# Patient Record
Sex: Male | Born: 1945 | Race: White | Hispanic: No | Marital: Single | Smoking: Never smoker
Health system: Southern US, Community
[De-identification: ages and names within clinical notes are randomized; demographics above are authoritative.]

## PROBLEM LIST (undated history)

## (undated) DIAGNOSIS — I10 Essential (primary) hypertension: Secondary | ICD-10-CM

## (undated) DIAGNOSIS — M109 Gout, unspecified: Secondary | ICD-10-CM

## (undated) HISTORY — PX: BACK SURGERY: SHX140

## (undated) HISTORY — PX: UMBILICAL HERNIA REPAIR: SHX196

## (undated) HISTORY — PX: CHOLECYSTECTOMY: SHX55

## (undated) HISTORY — PX: NECK SURGERY: SHX720

---

## 2002-04-28 ENCOUNTER — Encounter: Payer: Self-pay | Admitting: Family Medicine

## 2002-04-28 ENCOUNTER — Ambulatory Visit (HOSPITAL_COMMUNITY): Admission: RE | Admit: 2002-04-28 | Discharge: 2002-04-28 | Payer: Self-pay | Admitting: Family Medicine

## 2014-02-26 ENCOUNTER — Encounter (HOSPITAL_COMMUNITY): Payer: Self-pay | Admitting: Emergency Medicine

## 2014-02-26 ENCOUNTER — Emergency Department (HOSPITAL_COMMUNITY): Payer: Federal, State, Local not specified - PPO

## 2014-02-26 ENCOUNTER — Emergency Department (HOSPITAL_COMMUNITY)
Admission: EM | Admit: 2014-02-26 | Discharge: 2014-02-26 | Disposition: A | Discharge status: Home / Self Care | Payer: Federal, State, Local not specified - PPO | Attending: Emergency Medicine | Admitting: Emergency Medicine

## 2014-02-26 DIAGNOSIS — Y9389 Activity, other specified: Secondary | ICD-10-CM | POA: Insufficient documentation

## 2014-02-26 DIAGNOSIS — Z791 Long term (current) use of non-steroidal anti-inflammatories (NSAID): Secondary | ICD-10-CM | POA: Insufficient documentation

## 2014-02-26 DIAGNOSIS — R05 Cough: Secondary | ICD-10-CM

## 2014-02-26 DIAGNOSIS — Y9289 Other specified places as the place of occurrence of the external cause: Secondary | ICD-10-CM | POA: Insufficient documentation

## 2014-02-26 DIAGNOSIS — X500XXA Overexertion from strenuous movement or load, initial encounter: Secondary | ICD-10-CM | POA: Insufficient documentation

## 2014-02-26 DIAGNOSIS — R059 Cough, unspecified: Secondary | ICD-10-CM | POA: Insufficient documentation

## 2014-02-26 DIAGNOSIS — S93409A Sprain of unspecified ligament of unspecified ankle, initial encounter: Secondary | ICD-10-CM | POA: Insufficient documentation

## 2014-02-26 DIAGNOSIS — I10 Essential (primary) hypertension: Secondary | ICD-10-CM | POA: Insufficient documentation

## 2014-02-26 DIAGNOSIS — Z79899 Other long term (current) drug therapy: Secondary | ICD-10-CM | POA: Insufficient documentation

## 2014-02-26 HISTORY — DX: Essential (primary) hypertension: I10

## 2014-02-26 MED ORDER — BENZONATATE 200 MG PO CAPS: 200.0000 mg | ORAL_CAPSULE | Freq: Three times a day (TID) | ORAL | Status: DC | PRN

## 2014-02-26 NOTE — Discharge Instructions (Signed)
Ankle Sprain An ankle sprain is an injury to the strong, fibrous tissues (ligaments) that hold your ankle bones together.  HOME CARE   Put ice on your ankle for 1 2 days or as told by your doctor.  Put ice in a plastic bag.  Place a towel between your skin and the bag.  Leave the ice on for 15-20 minutes at a time, every 2 hours while you are awake.  Only take medicine as told by your doctor.  Raise (elevate) your injured ankle above the level of your heart as much as possible for 2 3 days.  Use crutches if your doctor tells you to. Slowly put your own weight on the affected ankle. Use the crutches until you can walk without pain.  If you have a plaster splint:  Do not rest it on anything harder than a pillow for 24 hours.  Do not put weight on it.  Do not get it wet.  Take it off to shower or bathe.  If given, use an elastic wrap or support stocking for support. Take the wrap off if your toes lose feeling (numb), tingle, or turn cold or blue.  If you have an air splint:  Add or let out air to make it comfortable.  Take it off at night and to shower and bathe.  Wiggle your toes and move your ankle up and down often while you are wearing it. GET HELP RIGHT AWAY IF:   Your toes lose feeling (numb) or turn blue.  You have severe pain that is increasing.  You have rapidly increasing bruising or puffiness (swelling).  Your toes feel very cold.  You lose feeling in your foot.  Your medicine does not help your pain. MAKE SURE YOU:   Understand these instructions.  Will watch your condition.  Will get help right away if you are not doing well or get worse. Document Released: 05/08/2008 Document Revised: 08/14/2012 Document Reviewed: 06/03/2012 [facility] LP[facility] Patient Information 2014 [place], [place].  Cough, Adult  A cough is a reflex. It helps you clear your throat and airways. A cough can help heal your body. A cough can last 2 or 3 weeks (acute) or may last more than  8 weeks (chronic). Some common causes of a cough can include an infection, allergy, or a cold. HOME CARE  Only take medicine as told by your doctor.  If given, take your medicines (antibiotics) as told. Finish them even if you start to feel better.  Use a cold steam vaporizer or humidier in your home. This can help loosen thick spit (secretions).  Sleep so you are almost sitting up (semi-upright). Use pillows to do this. This helps reduce coughing.  Rest as needed.  Stop smoking if you smoke. GET HELP RIGHT AWAY IF:  You have yellowish-white fluid (pus) in your thick spit.  Your cough gets worse.  Your medicine does not reduce coughing, and you are losing sleep.  You cough up blood.  You have trouble breathing.  Your pain gets worse and medicine does not help.  You have a fever. MAKE SURE YOU:   Understand these instructions.  Will watch your condition.  Will get help right away if you are not doing well or get worse. Document Released: 08/03/2011 Document Revised: 02/12/2012 Document Reviewed: 08/03/2011 [facility] Patient Information 2014 [place], [place].

## 2014-02-26 NOTE — ED Notes (Signed)
Pt stepped in hole in yard, Pain, swelling present esp rt lat malleolus

## 2014-02-26 NOTE — ED Notes (Signed)
Pt reports that right ankle since last Tuesday. Ankle with edema and erythema on lateral surface. Denies fevers and chills.

## 2014-02-28 NOTE — ED Provider Notes (Signed)
CSN: [id number]     Arrival date & time 02/26/14  1032 History   First MD Initiated Contact with Patient 02/26/14 1045     Chief Complaint  Patient presents with  . Ankle Pain     (Consider location/radiation/quality/duration/timing/severity/associated sxs/prior Treatment) Patient is a 68 y.o. male presenting with ankle pain. The history is provided by the patient.  Ankle Pain Location:  Ankle Time since incident:  1 week Injury: yes   Mechanism of injury comment:  Twisting injury to the right ankle after stepping on an uneven surface Ankle location:  R ankle Pain details:    Quality:  Aching and throbbing   Radiates to:  Does not radiate   Severity:  Moderate   Onset quality:  Sudden   Timing:  Constant   Progression:  Unchanged Chronicity:  New Dislocation: no   Foreign body present:  No foreign bodies Prior injury to area:  No Relieved by:  Nothing Worsened by:  Bearing weight and flexion Ineffective treatments: Epsom. salt soaks. Associated symptoms: swelling   Associated symptoms: no back pain, no decreased ROM, no fatigue, no fever, no neck pain, no numbness and no tingling    Patient also complains of persistent cough for several days. States cough is productive at times of green to yellow sputum. He denies fever, chills, shortness of breath, or chest pain.   Past Medical History  Diagnosis Date  . Hypertension    Past Surgical History  Procedure Laterality Date  . Cholecystectomy    . Neck surgery     History reviewed. No pertinent family history. History  Substance Use Topics  . Smoking status: Never Smoker   . Smokeless tobacco: Not on file  . Alcohol Use: No    Review of Systems  Constitutional: Negative for fever, chills, activity change, appetite change and fatigue.  HENT: Positive for congestion. Negative for sore throat and trouble swallowing.   Respiratory: Positive for cough. Negative for chest tightness, shortness of breath, wheezing and  stridor.   Cardiovascular: Negative for chest pain and palpitations.  Gastrointestinal: Negative for nausea, vomiting and abdominal pain.  Genitourinary: Negative for dysuria and difficulty urinating.  Musculoskeletal: Positive for arthralgias and joint swelling. Negative for back pain and neck pain.  Skin: Negative for color change and wound.  All other systems reviewed and are negative.      Allergies  Review of patient's allergies indicates no known allergies.  Home Medications   Current Outpatient Rx  Name  Route  Sig  Dispense  Refill  . allopurinol (ZYLOPRIM) 300 MG tablet   Oral   Take 0.5 tablets by mouth daily.         . busPIRone (BUSPAR) 10 MG tablet   Oral   Take 1 tablet by mouth 3 (three) times daily.         . cholecalciferol (VITAMIN D) 1000 UNITS tablet   Oral   Take 1,000 Units by mouth daily.         . hydroxypropyl methylcellulose (ISOPTO TEARS) 2.5 % ophthalmic solution   Both Eyes   Place 1 drop into both eyes daily.         Marland Kitchen. ibuprofen (ADVIL,MOTRIN) 400 MG tablet   Oral   Take 1 tablet by mouth 3 (three) times daily.         . methocarbamol (ROBAXIN) 750 MG tablet   Oral   Take 750 mg by mouth 4 (four) times daily.         .Marland Kitchen  nortriptyline (PAMELOR) 50 MG capsule   Oral   Take 1 capsule by mouth at bedtime.         . Omega-3 Fatty Acids (FISH OIL) 1000 MG CAPS   Oral   Take 2 capsules by mouth 3 (three) times daily before meals.         Marland Kitchen omeprazole (PRILOSEC) 20 MG capsule   Oral   Take 1 capsule by mouth daily.         Marland Kitchen terazosin (HYTRIN) 2 MG capsule   Oral   Take 1 capsule by mouth 2 (two) times daily.         Marland Kitchen triamterene-hydrochlorothiazide (MAXZIDE) 75-50 MG per tablet   Oral   Take 1 tablet by mouth daily.         . benzonatate (TESSALON) 200 MG capsule   Oral   Take 1 capsule (200 mg total) by mouth 3 (three) times daily as needed for cough. Swallow whole, do not chew   21 capsule   0    BP  129/83  Pulse 100  Temp(Src) 98 F (36.7 C) (Oral)  Resp 20  Ht 5\' 6"  (1.676 m)  Wt 192 lb (87.091 kg)  BMI 31.00 kg/m2  SpO2 96% Physical Exam  Nursing note and vitals reviewed. Constitutional: He is oriented to person, place, and time. He appears well-developed and well-nourished. No distress.  HENT:  Head: Normocephalic and atraumatic.  Mouth/Throat: Oropharynx is clear and moist. No oropharyngeal exudate.  Neck: Normal range of motion. Neck supple. No thyromegaly present.  Cardiovascular: Normal rate, regular rhythm, normal heart sounds and intact distal pulses.   No murmur heard. Pulmonary/Chest: Effort normal and breath sounds normal. No respiratory distress. He has no wheezes. He has no rales. He exhibits no tenderness.  Abdominal: Soft. He exhibits no distension. There is no tenderness. There is no rebound and no guarding.  Musculoskeletal: He exhibits edema and tenderness.  Tenderness to palpation with moderate to soft tissue swelling to the lateral right malleolus.  Moderate bruising is present.  ROM is preserved.  DP pulse is brisk,distal sensation intact.  No erythema, abrasion, or bony deformity.  No proximal tenderness. Compartments are soft  Lymphadenopathy:    He has no cervical adenopathy.  Neurological: He is alert and oriented to person, place, and time. He exhibits normal muscle tone. Coordination normal.  Skin: Skin is warm and dry.    ED Course  Procedures (including critical care time) Labs Review Labs Reviewed - No data to display Imaging Review Dg Ankle Complete Right  02/26/2014   CLINICAL DATA:  Ankle pain  EXAM: RIGHT ANKLE - COMPLETE 3+ VIEW  COMPARISON:  None.  FINDINGS: Three views of the right ankle reveal the joint mortise to be preserved. The talar dome is intact. There is no evidence of an acute malleolar fracture. The talus and calcaneus exhibit no acute abnormalities. A tiny plantar calcaneal spur is present. There is mild soft tissue swelling  diffusely over the ankle. vVscular type calcifications are demonstrated anteriorly.  IMPRESSION: No acute bony abnormality of the right ankle is demonstrated. There is mild soft tissue swelling. If the patient's symptoms persist and remain unexplained, MRI may be useful.   Electronically Signed   By: David  [place]   On: 02/26/2014 11:06     EKG Interpretation None      MDM   Final diagnoses:  Ankle sprain  Cough    X-rays reviewed and discussed with the patient.  ASO applied,  pain improved, remains NV intact.  He agrees to elevate and apply ice, he was given  Orthopedic referral.   Advised to followup with his primary care physician for recheck regarding the cough if symptoms are not improving. Vital signs are stable. No tachycardia, hypoxia, or tachypnea. Clinical suspicion for PE is low .    Patient is well appearing. And stable for discharge at this time.     Yessenia Maillet L. Trisha Mangle, PA-C 02/28/14 [phone]

## 2014-03-04 NOTE — ED Provider Notes (Signed)
Medical screening examination/treatment/procedure(s) were performed by non-physician practitioner and as supervising physician I was immediately available for consultation/collaboration.   EKG Interpretation None        Urvi Imes L Vedanth Sirico, MD 03/04/14 1507 

## 2015-02-24 ENCOUNTER — Emergency Department (HOSPITAL_COMMUNITY): Payer: Non-veteran care

## 2015-02-24 ENCOUNTER — Encounter (HOSPITAL_COMMUNITY): Payer: Self-pay | Admitting: Emergency Medicine

## 2015-02-24 ENCOUNTER — Emergency Department (HOSPITAL_COMMUNITY)
Admission: EM | Admit: 2015-02-24 | Discharge: 2015-02-24 | Disposition: A | Discharge status: Home / Self Care | Payer: Non-veteran care | Attending: Emergency Medicine | Admitting: Emergency Medicine

## 2015-02-24 DIAGNOSIS — Z9049 Acquired absence of other specified parts of digestive tract: Secondary | ICD-10-CM | POA: Insufficient documentation

## 2015-02-24 DIAGNOSIS — R109 Unspecified abdominal pain: Secondary | ICD-10-CM

## 2015-02-24 DIAGNOSIS — M549 Dorsalgia, unspecified: Secondary | ICD-10-CM | POA: Diagnosis present

## 2015-02-24 DIAGNOSIS — I10 Essential (primary) hypertension: Secondary | ICD-10-CM | POA: Insufficient documentation

## 2015-02-24 DIAGNOSIS — Z79899 Other long term (current) drug therapy: Secondary | ICD-10-CM | POA: Diagnosis not present

## 2015-02-24 DIAGNOSIS — N200 Calculus of kidney: Secondary | ICD-10-CM | POA: Diagnosis not present

## 2015-02-24 DIAGNOSIS — E663 Overweight: Secondary | ICD-10-CM | POA: Diagnosis not present

## 2015-02-24 DIAGNOSIS — R911 Solitary pulmonary nodule: Secondary | ICD-10-CM | POA: Diagnosis not present

## 2015-02-24 LAB — BASIC METABOLIC PANEL
ANION GAP: 9 (ref 5–15)
BUN: 14 mg/dL (ref 6–23)
CALCIUM: 8.9 mg/dL (ref 8.4–10.5)
CO2: 28 mmol/L (ref 19–32)
Chloride: 104 mmol/L (ref 96–112)
Creatinine, Ser: 0.97 mg/dL (ref 0.50–1.35)
GFR calc non Af Amer: 82 mL/min — ABNORMAL LOW (ref >90)
Glucose, Bld: 145 mg/dL — ABNORMAL HIGH (ref 70–99)
Potassium: 3.1 mmol/L — ABNORMAL LOW (ref 3.5–5.1)
SODIUM: 141 mmol/L (ref 135–145)

## 2015-02-24 LAB — URINE MICROSCOPIC-ADD ON

## 2015-02-24 LAB — CBC WITH DIFFERENTIAL/PLATELET
BASOS ABS: 0 10*3/uL (ref 0.0–0.1)
Basophils Relative: 0 % (ref 0–1)
EOS ABS: 0.3 10*3/uL (ref 0.0–0.7)
EOS PCT: 5 % (ref 0–5)
HEMATOCRIT: 44.3 % (ref 39.0–52.0)
HEMOGLOBIN: 15.6 g/dL (ref 13.0–17.0)
Lymphocytes Relative: 50 % — ABNORMAL HIGH (ref 12–46)
Lymphs Abs: 3.5 10*3/uL (ref 0.7–4.0)
MCH: 32.6 pg (ref 26.0–34.0)
MCHC: 35.2 g/dL (ref 30.0–36.0)
MCV: 92.7 fL (ref 78.0–100.0)
MONO ABS: 0.8 10*3/uL (ref 0.1–1.0)
MONOS PCT: 12 % (ref 3–12)
Neutro Abs: 2.3 10*3/uL (ref 1.7–7.7)
Neutrophils Relative %: 33 % — ABNORMAL LOW (ref 43–77)
Platelets: 148 10*3/uL — ABNORMAL LOW (ref 150–400)
RBC: 4.78 MIL/uL (ref 4.22–5.81)
RDW: 13.6 % (ref 11.5–15.5)
WBC: 7 10*3/uL (ref 4.0–10.5)

## 2015-02-24 LAB — URINALYSIS, ROUTINE W REFLEX MICROSCOPIC
Bilirubin Urine: NEGATIVE
GLUCOSE, UA: NEGATIVE mg/dL
KETONES UR: NEGATIVE mg/dL
Leukocytes, UA: NEGATIVE
Nitrite: NEGATIVE
PROTEIN: NEGATIVE mg/dL
Specific Gravity, Urine: >1.03 — ABNORMAL HIGH (ref 1.005–1.030)
Urobilinogen, UA: 0.2 mg/dL (ref 0.0–1.0)
pH: 6 (ref 5.0–8.0)

## 2015-02-24 MED ORDER — TAMSULOSIN HCL 0.4 MG PO CAPS
0.4000 mg | ORAL_CAPSULE | Freq: Every day | ORAL | Status: AC
Start: 2015-02-24 — End: ?

## 2015-02-24 MED ORDER — MORPHINE SULFATE 4 MG/ML IJ SOLN
4.0000 mg | Freq: Once | INTRAMUSCULAR | Status: AC
  Administered 2015-02-24: 4 mg via INTRAVENOUS
  Filled 2015-02-24: qty 1

## 2015-02-24 MED ORDER — SODIUM CHLORIDE 0.9 % IV BOLUS (SEPSIS)
1000.0000 mL | Freq: Once | INTRAVENOUS | Status: AC
  Administered 2015-02-24: 1000 mL via INTRAVENOUS

## 2015-02-24 MED ORDER — OXYCODONE-ACETAMINOPHEN 5-325 MG PO TABS: 1.0000 | ORAL_TABLET | Freq: Four times a day (QID) | ORAL | Status: AC | PRN

## 2015-02-24 MED ORDER — ONDANSETRON HCL 4 MG/2ML IJ SOLN
4.0000 mg | Freq: Once | INTRAMUSCULAR | Status: AC
  Administered 2015-02-24: 4 mg via INTRAMUSCULAR
  Filled 2015-02-24: qty 2

## 2015-02-24 NOTE — Discharge Instructions (Signed)

## 2015-02-24 NOTE — ED Provider Notes (Signed)
CSN: [id number]     Arrival date & time 02/24/15  5621 History   First MD Initiated Contact with Patient 02/24/15 [phone]     Chief Complaint  Patient presents with  . Back Pain     (Consider location/radiation/quality/duration/timing/severity/associated sxs/prior Treatment) HPI  This is a 69 year old male with a history of hypertension who presents with acute onset of left flank and back pain. Patient reports onset of pain at approximately 2 AM. Pain is nonradiating. Currently it is 7 out of 10. He states that it comes and goes. It is sharp. Denies any urinary symptoms. No history of kidney stones. Denies any weakness, numbness, tingling of lower extremities. He states that he had difficulty getting comfortable earlier. He took Motrin and a muscle relaxer with minimal relief. Denies any fevers or steroid use. Denies any chest pain, shortness of breath. Endorses nausea without vomiting.  Past Medical History  Diagnosis Date  . Hypertension    Past Surgical History  Procedure Laterality Date  . Cholecystectomy    . Neck surgery     History reviewed. No pertinent family history. History  Substance Use Topics  . Smoking status: Never Smoker   . Smokeless tobacco: Not on file  . Alcohol Use: No    Review of Systems  Constitutional: Negative.  Negative for fever.  Respiratory: Negative.  Negative for chest tightness and shortness of breath.   Cardiovascular: Negative.  Negative for chest pain.  Gastrointestinal: Positive for nausea. Negative for vomiting, abdominal pain and constipation.  Genitourinary: Positive for flank pain. Negative for dysuria and hematuria.  Musculoskeletal: Positive for back pain.  Neurological: Negative for headaches.  All other systems reviewed and are negative.     Allergies  Review of patient's allergies indicates no known allergies.  Home Medications   Prior to Admission medications   Medication Sig Start Date End Date Taking? Authorizing  Provider  allopurinol (ZYLOPRIM) 300 MG tablet Take 0.5 tablets by mouth daily. 12/09/13  Yes Historical Provider, MD  benzonatate (TESSALON) 200 MG capsule Take 1 capsule (200 mg total) by mouth 3 (three) times daily as needed for cough. Swallow whole, do not chew 02/26/14  Yes Tammi Triplett, PA-C  busPIRone (BUSPAR) 10 MG tablet Take 1 tablet by mouth 3 (three) times daily. 02/09/14  Yes Historical Provider, MD  cholecalciferol (VITAMIN D) 1000 UNITS tablet Take 1,000 Units by mouth daily.   Yes Historical Provider, MD  hydroxypropyl methylcellulose (ISOPTO TEARS) 2.5 % ophthalmic solution Place 1 drop into both eyes daily.   Yes Historical Provider, MD  ibuprofen (ADVIL,MOTRIN) 400 MG tablet Take 1 tablet by mouth 3 (three) times daily. 02/06/14  Yes Historical Provider, MD  methocarbamol (ROBAXIN) 750 MG tablet Take 750 mg by mouth 4 (four) times daily.   Yes Historical Provider, MD  nortriptyline (PAMELOR) 50 MG capsule Take 1 capsule by mouth at bedtime. 01/15/14  Yes Historical Provider, MD  Omega-3 Fatty Acids (FISH OIL) 1000 MG CAPS Take 2 capsules by mouth 3 (three) times daily before meals.   Yes Historical Provider, MD  omeprazole (PRILOSEC) 20 MG capsule Take 1 capsule by mouth daily. 01/22/14  Yes Historical Provider, MD  terazosin (HYTRIN) 2 MG capsule Take 1 capsule by mouth 2 (two) times daily. 01/16/14  Yes Historical Provider, MD  triamterene-hydrochlorothiazide (MAXZIDE) 75-50 MG per tablet Take 1 tablet by mouth daily.   Yes Historical Provider, MD  oxyCODONE-acetaminophen (PERCOCET/ROXICET) 5-325 MG per tablet Take 1 tablet by mouth every 6 (six) hours as  needed for severe pain. 02/24/15   Shon Baton, MD  tamsulosin (FLOMAX) 0.4 MG CAPS capsule Take 1 capsule (0.4 mg total) by mouth daily. 02/24/15   Shon Baton, MD   BP 127/86 mmHg  Pulse 72  Temp(Src) 97.6 F (36.4 C) (Oral)  Resp 20  Ht  (1.702 m)  Wt 194 lb (87.998 kg)  BMI 30.38 kg/m2  SpO2 97% Physical Exam   Constitutional: He is oriented to person, place, and time. He appears well-developed and well-nourished.  Overweight  HENT:  Head: Normocephalic and atraumatic.  Cardiovascular: Normal rate, regular rhythm and normal heart sounds.   No murmur heard. Pulmonary/Chest: Effort normal and breath sounds normal. No respiratory distress. He has no wheezes.  Abdominal: Soft. Bowel sounds are normal. There is no tenderness. There is no rebound.  Genitourinary:  No CVA tenderness  Musculoskeletal: He exhibits no edema.  Neurological: He is alert and oriented to person, place, and time.  Skin: Skin is warm and dry.  Psychiatric: He has a normal mood and affect.  Nursing note and vitals reviewed.   ED Course  Procedures (including critical care time) Labs Review Labs Reviewed  URINALYSIS, ROUTINE W REFLEX MICROSCOPIC - Abnormal; Notable for the following:    Specific Gravity, Urine >1.030 (*)    Hgb urine dipstick MODERATE (*)    All other components within normal limits  CBC WITH DIFFERENTIAL/PLATELET - Abnormal; Notable for the following:    Platelets 148 (*)    Neutrophils Relative % 33 (*)    Lymphocytes Relative 50 (*)    All other components within normal limits  BASIC METABOLIC PANEL - Abnormal; Notable for the following:    Potassium 3.1 (*)    Glucose, Bld 145 (*)    GFR calc non Af Amer 82 (*)    All other components within normal limits  URINE MICROSCOPIC-ADD ON    Imaging Review Ct Renal Stone Study  02/24/2015   CLINICAL DATA:  Initial evaluation for acute left-sided abdominal pain since yesterday.  EXAM: CT ABDOMEN AND PELVIS WITHOUT CONTRAST  TECHNIQUE: Multidetector CT imaging of the abdomen and pelvis was performed following the standard protocol without IV contrast.  COMPARISON:  None.  FINDINGS: Minimal dependent atelectasis noted within the right lung base. There is a 7 mm left lower lobe nodule (series 6, image 16).  The liver demonstrates a normal unenhanced  appearance. Gallbladder is absent. No biliary dilatation. Spleen, adrenal glands, and pancreas demonstrate a normal unenhanced appearance.  On the right, there is no evidence for nephrolithiasis or hydronephrosis. No radiopaque calculi seen along the course of the right renal collecting system.  On the left, there is a 4 mm stone lodged within the distal left ureter (series 2, image 80). No significant left-sided hydronephrosis or hydroureter. No other radiopaque calculi seen within the left kidney or along the course of the left renal collecting system.  Stomach within normal limits. No evidence for bowel obstruction. No abnormal wall thickening or inflammatory stranding seen about the bowels. Sigmoid diverticulosis without evidence for diverticulitis.  Bladder decompressed but grossly normal.  Prostate unremarkable.  Fat containing left inguinal hernia noted.  No free air or fluid. No pathologically enlarged intra-abdominal pelvic lymph nodes identified. 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 No aneurysm.  Prominent multilevel degenerative changes noted within the visualized lumbar spine. 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 No listhesis. No acute osseous abnormality. No worrisome lytic or blastic osseous lesion.  IMPRESSION:  1. 4 mm stone lodged within the distal left ureter without significant hydronephrosis or hydroureter. This stone is positioned approximately 12 mm proximal to the left UVJ. 2. No other acute intra-abdominal or pelvic process. 3. Colonic diverticulosis without acute diverticulitis. 4. 7 mm left lower lobe nodule, indeterminate. If the patient is at high risk for bronchogenic carcinoma, follow-up chest CT at 3-396months is recommended. If the patient is at low risk for bronchogenic carcinoma, follow-up chest CT at 6-12 months is recommended. This recommendation follows the consensus statement: Guidelines for Management of Small Pulmonary Nodules Detected  on CT Scans: A Statement from the Fleischner Society as published in Radiology 2005; 237:395-400.   Electronically Signed   By: Rise MuBenjamin  McClintock M.D.   On: 02/24/2015 04:28     EKG Interpretation None      MDM   Final diagnoses:  Flank pain  Kidney stone  Lung nodule    Patient presents with acute onset of left-sided flank pain. Nontoxic on exam but uncomfortable appearing. Vital signs within normal limits. No significant tenderness on exam. History physical suggestive of possible kidney stones. Lab work obtained. Patient given fluids, nausea medication and pain medication. Lab work notable for hematuria and CT scan shows 4 mm left-sided kidney stone.  CT scan also shows a left lower lobe nodule. Patient does report a remote history of smoking. Discussed with patient follow-up in 3-6 months for CT scan. [facility] He [facility] a copy of his CT scan. Patient able to tolerate fluids. Will be discharged home with pain medication and Flomax. Given urology follow-up if needed. Anticipatory guidance counseled.  After history, exam, and medical workup I feel the patient has been appropriately medically screened and is safe for discharge home. Pertinent diagnoses were discussed with the patient. Patient was given return precautions.     Shon Batonourtney F Horton, MD 02/24/15 [phone]

## 2015-02-24 NOTE — ED Notes (Signed)
Pt c/o sudden onset of left back pain.

## 2015-12-30 IMAGING — CT CT RENAL STONE PROTOCOL
2 of 4 series · 16 of 46 positions shown, 18 images · non-contrast
Comparison: None.

CLINICAL DATA: Initial evaluation for acute left-sided abdominal
pain since yesterday.

EXAM:
CT ABDOMEN AND PELVIS WITHOUT CONTRAST
TECHNIQUE: Multidetector CT imaging of the abdomen and pelvis was performed
following the standard protocol without IV contrast.

[Series 2: standard/full over (age)lbs 5.0 · axial · 0.85mm/px · z∈[-445,-5]mm · 13 of 98 slices shown, 15 images]
[im 5/98  soft-tissue]
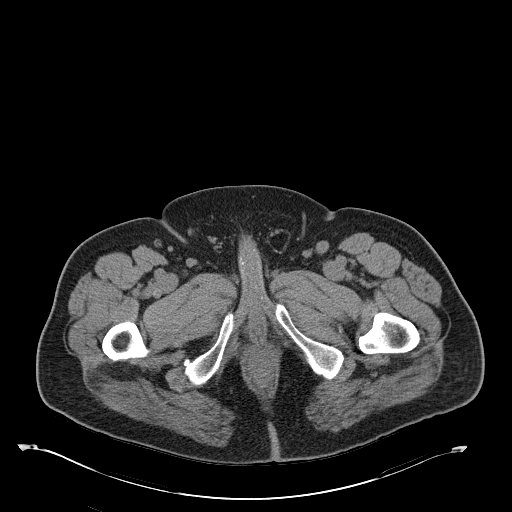
[im 5/98  bone]
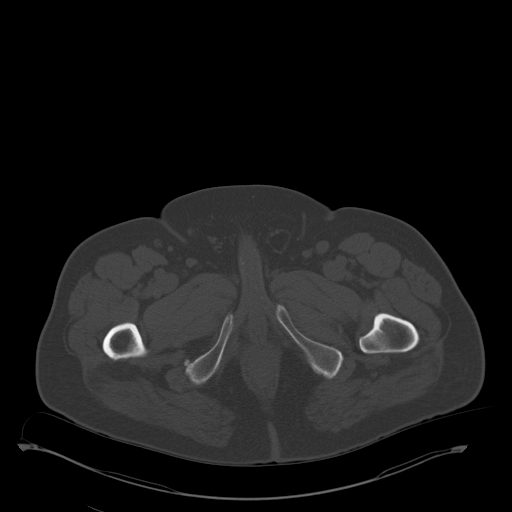
[im 14/98  soft-tissue]
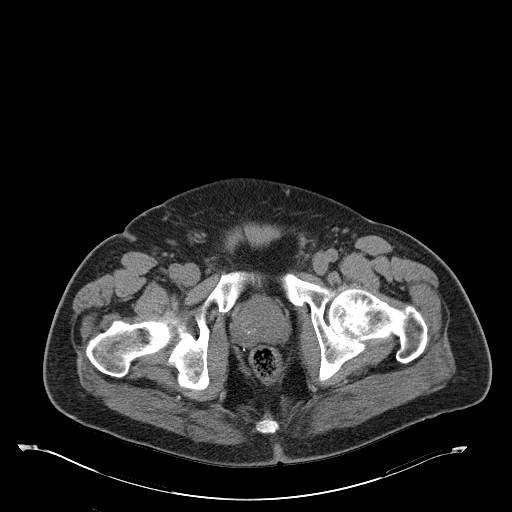
[im 23/98  soft-tissue]
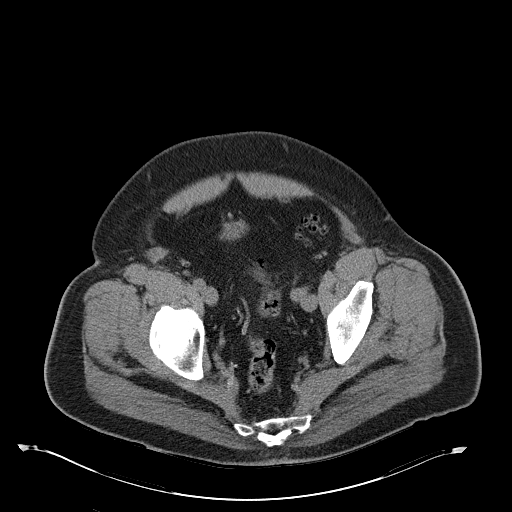
[im 27/98  soft-tissue]
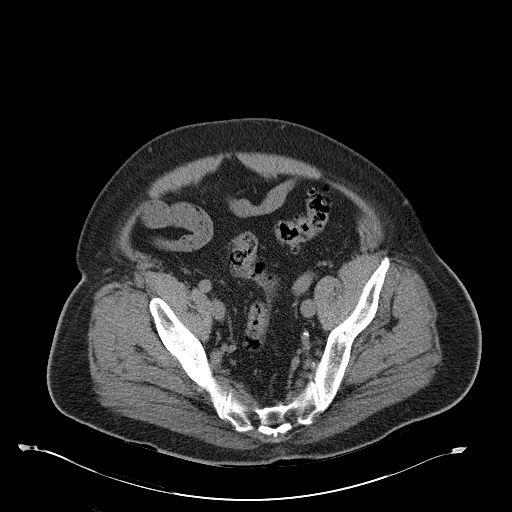
[im 36/98  soft-tissue]
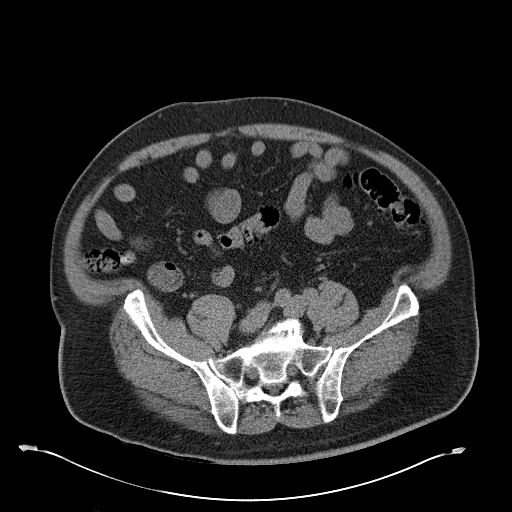
[im 40/98  soft-tissue]
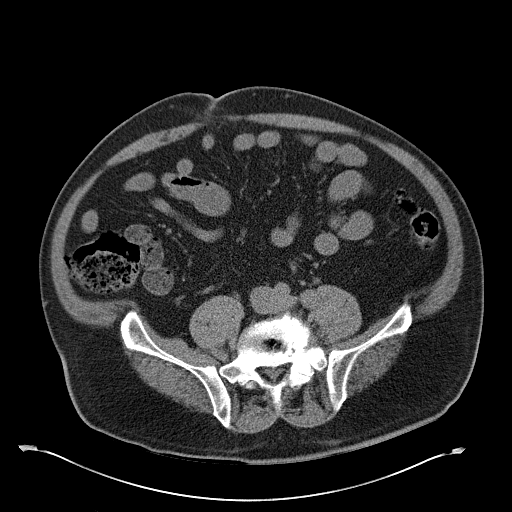
[im 49/98  soft-tissue]
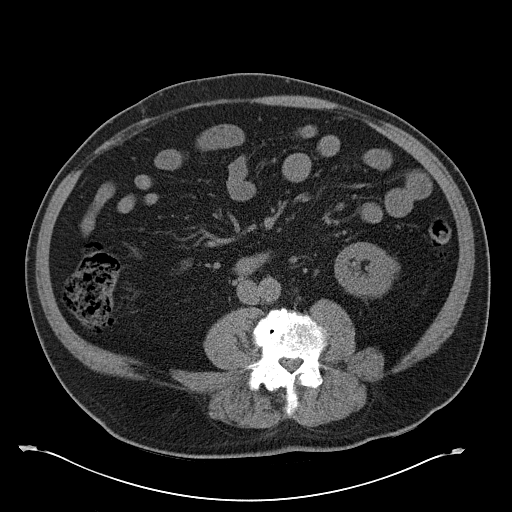
[im 58/98  soft-tissue]
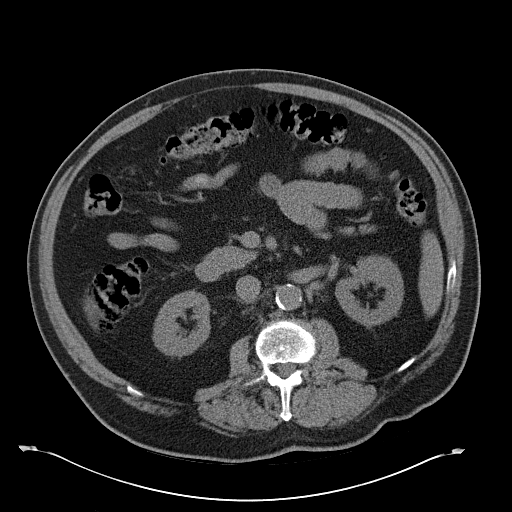
[im 62/98  soft-tissue]
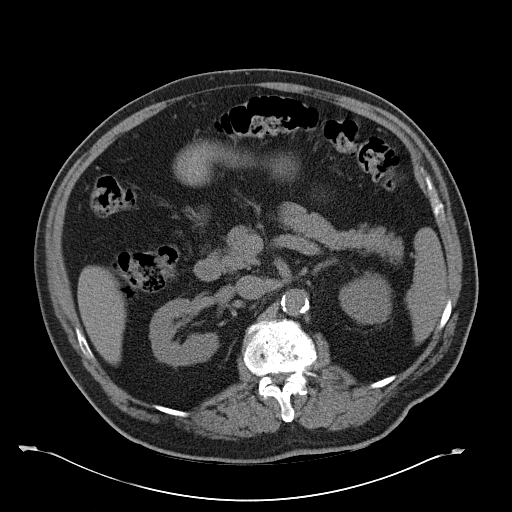
[im 62/98  bone]
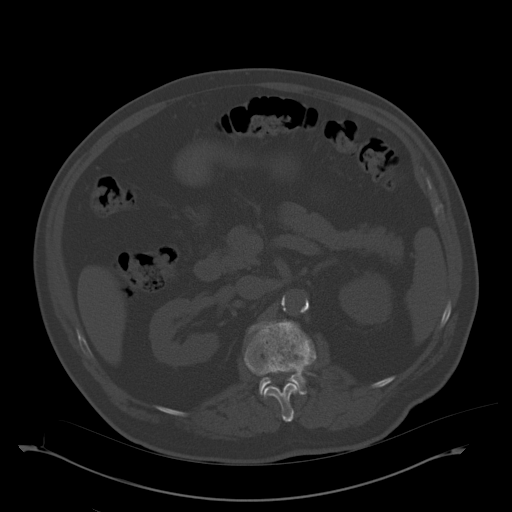
[im 71/98  soft-tissue]
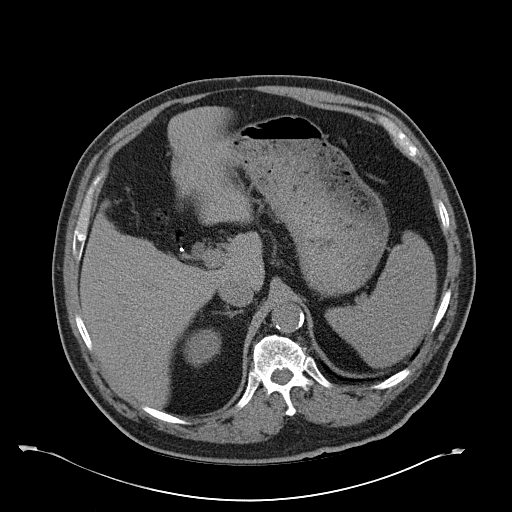
[im 75/98  soft-tissue]
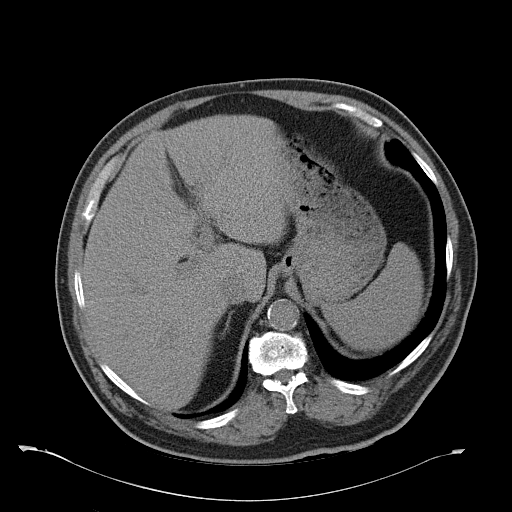
[im 84/98  soft-tissue]
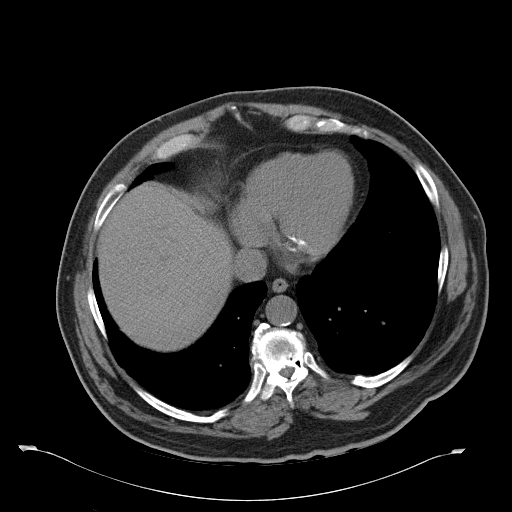
[im 93/98  soft-tissue]
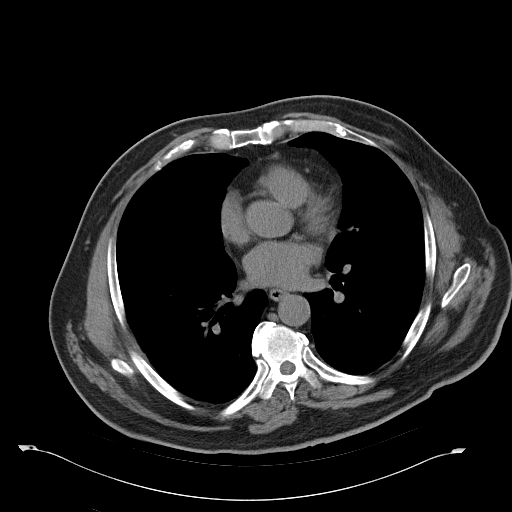

[Series 3: mpr coronal · coronal · 0.96mm/px · 3 of 122 slices shown]
[im 41/122  soft-tissue]
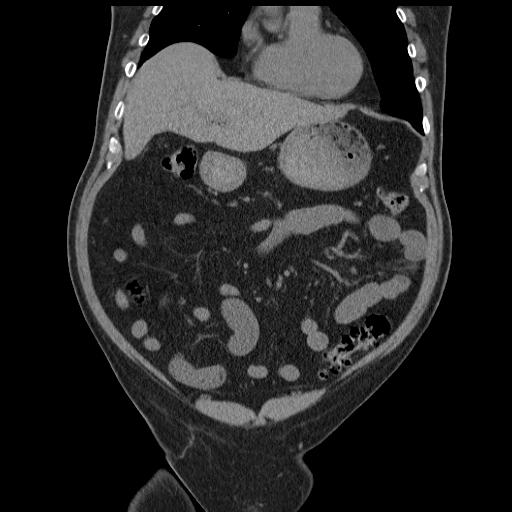
[im 54/122  soft-tissue]
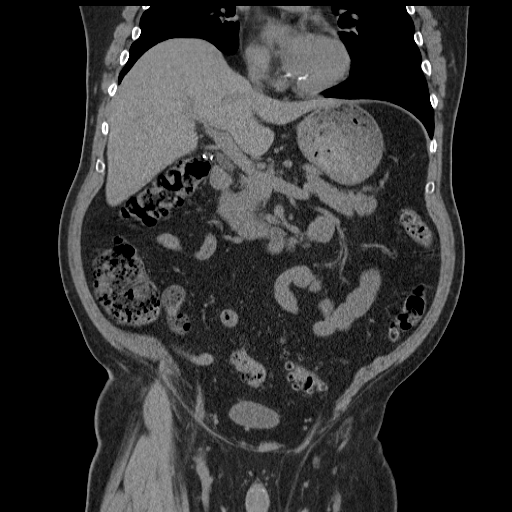
[im 68/122  soft-tissue]
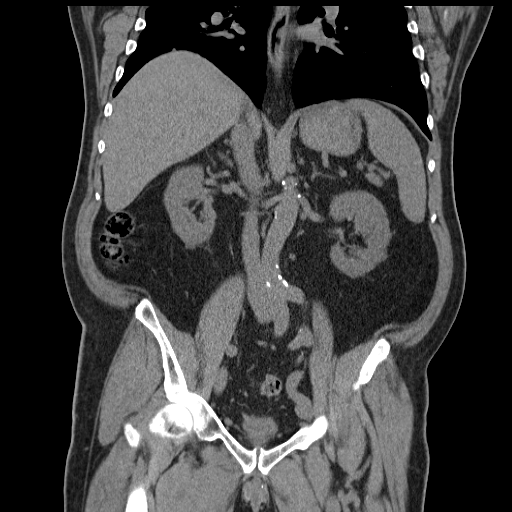

[16 of 46 positions shown; findings below may reference images not displayed]

FINDINGS: Minimal dependent atelectasis noted within the right lung base.
There is a 7 mm left lower lobe nodule (series 6, image 16).

The liver demonstrates a normal unenhanced appearance. Gallbladder
is absent. No biliary dilatation. Spleen, adrenal glands, and
pancreas demonstrate a normal unenhanced appearance.

On the right, there is no evidence for nephrolithiasis or
hydronephrosis. No radiopaque calculi seen along the course of the
right renal collecting system.

On the left, there is a 4 mm stone lodged within the distal left
ureter (series 2, image 80). No significant left-sided
hydronephrosis or hydroureter. No other radiopaque calculi seen
within the left kidney or along the course of the left renal
collecting system.

Stomach within normal limits. No evidence for bowel obstruction. No
abnormal wall thickening or inflammatory stranding seen about the
bowels. Sigmoid diverticulosis without evidence for diverticulitis.

Bladder decompressed but grossly normal.  Prostate unremarkable.

Fat containing left inguinal hernia noted.

No free air or fluid. No pathologically enlarged intra-abdominal
pelvic lymph nodes identified. Moderate atheromatous disease present
within the intra-abdominal aorta. No aneurysm.

Prominent multilevel degenerative changes noted within the
visualized lumbar spine. Chronic right-sided pars defect present at
L3. No listhesis. No acute osseous abnormality. No worrisome lytic
or blastic osseous lesion.
IMPRESSION: 1. 4 mm stone lodged within the distal left ureter without
significant hydronephrosis or hydroureter. This stone is positioned
approximately 12 mm proximal to the left UVJ.
2. No other acute intra-abdominal or pelvic process.
3. Colonic diverticulosis without acute diverticulitis.
4. 7 mm left lower lobe nodule, indeterminate. If the patient is at
high risk for bronchogenic carcinoma, follow-up chest CT at
3-2months is recommended. If the patient is at low risk for
bronchogenic carcinoma, follow-up chest CT at 6-12 months is
recommended. This recommendation follows the consensus statement:
Guidelines for Management of Small Pulmonary Nodules Detected on CT
Scans: A Statement from the [HOSPITAL] as published in

## 2018-08-01 ENCOUNTER — Other Ambulatory Visit: Payer: Self-pay | Admitting: Internal Medicine

## 2018-08-01 DIAGNOSIS — R339 Retention of urine, unspecified: Secondary | ICD-10-CM

## 2020-02-11 ENCOUNTER — Other Ambulatory Visit: Payer: Self-pay

## 2020-02-11 ENCOUNTER — Ambulatory Visit
Admission: EM | Admit: 2020-02-11 | Discharge: 2020-02-11 | Disposition: A | Discharge status: Home / Self Care | Payer: Federal, State, Local not specified - PPO | Attending: Emergency Medicine | Admitting: Emergency Medicine

## 2020-02-11 DIAGNOSIS — M25562 Pain in left knee: Secondary | ICD-10-CM | POA: Diagnosis not present

## 2020-02-11 HISTORY — DX: Gout, unspecified: M10.9

## 2020-02-11 NOTE — ED Provider Notes (Signed)
[facility]   [id number] 02/11/20 Arrival Time: 1761  CC: LT knee pain  SUBJECTIVE: History from: patient. Lance Spencer is a 74 y.o. male complains of left knee pain x 1 week.  Denies a precipitating event or specific injury.  Localizes the pain to the inside of left knee.  Describes the pain as intermittent and achy in character.  Has tried toradol and muscle relaxor without relief.  Worse with using knee to get up on furniture.  Denies fever, chills, erythema, ecchymosis, effusion, weakness, numbness and tingling.  ROS: As per HPI.  All other pertinent ROS negative.     Past Medical History:  Diagnosis Date  . Gout   . Hypertension    Past Surgical History:  Procedure Laterality Date  . BACK SURGERY    . CHOLECYSTECTOMY    . NECK SURGERY    . UMBILICAL HERNIA REPAIR     No Known Allergies No current facility-administered medications on file prior to encounter.   Current Outpatient Medications on File Prior to Encounter  Medication Sig Dispense Refill  . allopurinol (ZYLOPRIM) 300 MG tablet Take 0.5 tablets by mouth daily.    . busPIRone (BUSPAR) 10 MG tablet Take 1 tablet by mouth 3 (three) times daily.    . cholecalciferol (VITAMIN D) 1000 UNITS tablet Take 1,000 Units by mouth daily.    . hydroxypropyl methylcellulose (ISOPTO TEARS) 2.5 % ophthalmic solution Place 1 drop into both eyes daily.    Marland Kitchen ibuprofen (ADVIL,MOTRIN) 400 MG tablet Take 1 tablet by mouth 3 (three) times daily.    . methocarbamol (ROBAXIN) 750 MG tablet Take 750 mg by mouth 4 (four) times daily.    . nortriptyline (PAMELOR) 50 MG capsule Take 1 capsule by mouth at bedtime.    . Omega-3 Fatty Acids (FISH OIL) 1000 MG CAPS Take 2 capsules by mouth 3 (three) times daily before meals.    Marland Kitchen omeprazole (PRILOSEC) 20 MG capsule Take 1 capsule by mouth daily.    Marland Kitchen oxyCODONE-acetaminophen (PERCOCET/ROXICET) 5-325 MG per tablet Take 1 tablet by mouth every 6 (six) hours as needed for severe pain. 15  tablet 0  . tamsulosin (FLOMAX) 0.4 MG CAPS capsule Take 1 capsule (0.4 mg total) by mouth daily. 10 capsule 0  . terazosin (HYTRIN) 2 MG capsule Take 1 capsule by mouth 2 (two) times daily.    Marland Kitchen triamterene-hydrochlorothiazide (MAXZIDE) 75-50 MG per tablet Take 1 tablet by mouth daily.     Social History   Socioeconomic History  . Marital status: Single    Spouse name: Not on file  . Number of children: Not on file  . Years of education: Not on file  . Highest education level: Not on file  Occupational History  . Not on file  Tobacco Use  . Smoking status: Never Smoker  . Smokeless tobacco: Never Used  Substance and Sexual Activity  . Alcohol use: No  . Drug use: No  . Sexual activity: Not on file  Other Topics Concern  . Not on file  Social History Narrative  . Not on file   Social Determinants of Health   Financial Resource Strain:   . Difficulty of Paying Living Expenses: Not on file  Food Insecurity:   . Worried About Charity fundraiser in the Last Year: Not on file  . Ran Out of Food in the Last Year: Not on file  Transportation Needs:   . Lack of Transportation (Medical): Not on file  .  Lack of Transportation (Non-Medical): Not on file  Physical Activity:   . Days of Exercise per Week: Not on file  . Minutes of Exercise per Session: Not on file  Stress:   . Feeling of Stress : Not on file  Social Connections:   . Frequency of Communication with Friends and Family: Not on file  . Frequency of Social Gatherings with Friends and Family: Not on file  . Attends Religious Services: Not on file  . Active Member of Clubs or Organizations: Not on file  . Attends Banker Meetings: Not on file  . Marital Status: Not on file  Intimate Partner Violence:   . Fear of Current or Ex-Partner: Not on file  . Emotionally Abused: Not on file  . Physically Abused: Not on file  . Sexually Abused: Not on file   Family History  Problem Relation Age of Onset  .  Healthy Mother   . Healthy Father     OBJECTIVE:  Vitals:   02/11/20 1103  BP: (!) 152/78  Pulse: 86  Resp: 16  Temp: 97.6 F (36.4 C)  TempSrc: Oral  SpO2: 96%    General appearance: ALERT; in no acute distress.  Head: NCAT Lungs: Normal respiratory effort CV: Dorsalis pedis pulse 2+. Musculoskeletal: LT knee Inspection: Skin warm, dry, clear and intact without obvious erythema, effusion, or ecchymosis.  Palpation: Mildly TTP over medial joint line ROM: FROM active and passive Strength:  5/5 knee abduction, 5/5 knee adduction, 5/5 knee flexion, 5/5 knee extension Stability: Anterior/ posterior drawer intact Skin: warm and dry Neurologic: Ambulates without difficulty; Sensation intact about the lower extremities Psychological: alert and cooperative; normal mood and affect   ASSESSMENT & PLAN:  1. Acute pain of left knee     Continue conservative management of rest, ice, and gentle stretches Use prescribed medications as directed by the [facility] Knee brace given in office Follow up with PCP or orthopedist for further evaluation and management Return or go to the ER if you have any new or worsening symptoms (fever, chills, chest pain, swelling, redness, worsening symptoms despite medication, etc...)    Reviewed expectations re: course of current medical issues. Questions answered. Outlined signs and symptoms indicating need for more acute intervention. Patient verbalized understanding. After Visit Summary given.    Rennis Harding, PA-C 02/11/20 1139

## 2020-02-11 NOTE — Discharge Instructions (Signed)
Continue conservative management of rest, ice, and gentle stretches Use prescribed medications as directed by the [facility] Knee brace given in office Follow up with PCP or orthopedist for further evaluation and management Return or go to the ER if you have any new or worsening symptoms (fever, chills, chest pain, swelling, redness, worsening symptoms despite medication, etc...)

## 2020-02-11 NOTE — ED Triage Notes (Signed)
Pt presents to UC w/ c/o left knee pain x1 week. Denies falling or hitting knee. Left knee is not swollen or bruised.  Pt is able to walk but has pain when bearing weight on that leg.

## 2020-02-14 ENCOUNTER — Telehealth: Payer: Self-pay | Admitting: Emergency Medicine

## 2020-02-14 MED ORDER — PREDNISONE 10 MG PO TABS: 20.0000 mg | ORAL_TABLET | Freq: Every day | ORAL | 0 refills | Status: DC

## 2020-02-14 NOTE — Telephone Encounter (Signed)
Patient called requesting a prednisone.  Was seen on 02/11/2020 and was diagnosed with knee pain and was given the option to have a steroid prescription but declined.  Medication was sent.

## 2020-02-18 ENCOUNTER — Ambulatory Visit
Admission: EM | Admit: 2020-02-18 | Discharge: 2020-02-18 | Disposition: A | Discharge status: Home / Self Care | Payer: Non-veteran care | Attending: Emergency Medicine | Admitting: Emergency Medicine

## 2020-02-18 ENCOUNTER — Other Ambulatory Visit: Payer: Self-pay

## 2020-02-18 DIAGNOSIS — W57XXXA Bitten or stung by nonvenomous insect and other nonvenomous arthropods, initial encounter: Secondary | ICD-10-CM

## 2020-02-18 DIAGNOSIS — R21 Rash and other nonspecific skin eruption: Secondary | ICD-10-CM | POA: Diagnosis not present

## 2020-02-18 MED ORDER — DOXYCYCLINE HYCLATE 100 MG PO CAPS: 200.0000 mg | ORAL_CAPSULE | Freq: Once | ORAL | 0 refills | Status: AC

## 2020-02-18 NOTE — Discharge Instructions (Addendum)
Prescribed single prophylactic dose of doxycycline 200 mg To prevent tick bites, wear long sleeves, long pants, and light colors. Use DEET  insect repellent. Follow the instructions on the bottle. If the tick is biting, do not try to remove it with heat, alcohol, petroleum jelly, or fingernail polish. Use tweezers, curved forceps, or a tick-removal tool to grasp the tick. Gently pull up until the tick lets go. Do not twist or jerk the tick. Do not squeeze or crush the tick. Return here or go to ER if you have any new or worsening symptoms (rash, nausea, vomiting, fever, chills, headache, fatigue 

## 2020-02-18 NOTE — ED Provider Notes (Addendum)
[facility]    CSN: [id number] Arrival date & time: 02/18/20  0941      History   Chief Complaint Chief Complaint  Patient presents with  . Insect Bite    HPI Lance Spencer is a 74 y.o. male.   Who presented to the urgent care with a complaint of tick bite to  right thigh for the past 1 day.  Denies any precipitating event, noticed while changing clothes.  Localizes the bite to his right thigh.  Has removed the tick.  Reports area look irritated and red.  Denies previous history of tick bite.  Denies fever, chills, nausea, vomiting, headache, dizziness, weakness, fatigue,  or abdominal pain.      Past Medical History:  Diagnosis Date  . Gout   . Hypertension     There are no problems to display for this patient.   Past Surgical History:  Procedure Laterality Date  . BACK SURGERY    . CHOLECYSTECTOMY    . NECK SURGERY    . UMBILICAL HERNIA REPAIR         Home Medications    Prior to Admission medications   Medication Sig Start Date End Date Taking? Authorizing Provider  allopurinol (ZYLOPRIM) 300 MG tablet Take 0.5 tablets by mouth daily. 12/09/13   [provider]  busPIRone (BUSPAR) 10 MG tablet Take 1 tablet by mouth 3 (three) times daily. 02/09/14   [provider]  cholecalciferol (VITAMIN D) 1000 UNITS tablet Take 1,000 Units by mouth daily.    [provider]  hydroxypropyl methylcellulose (ISOPTO TEARS) 2.5 % ophthalmic solution Place 1 drop into both eyes daily.    [provider]  ibuprofen (ADVIL,MOTRIN) 400 MG tablet Take 1 tablet by mouth 3 (three) times daily. 02/06/14   [provider]  methocarbamol (ROBAXIN) 750 MG tablet Take 750 mg by mouth 4 (four) times daily.    [provider]  nortriptyline (PAMELOR) 50 MG capsule Take 1 capsule by mouth at bedtime. 01/15/14   [provider]  Omega-3 Fatty Acids (FISH OIL) 1000 MG CAPS Take 2 capsules by mouth 3 (three) times daily before  meals.    [provider]  omeprazole (PRILOSEC) 20 MG capsule Take 1 capsule by mouth daily. 01/22/14   [provider]  oxyCODONE-acetaminophen (PERCOCET/ROXICET) 5-325 MG per tablet Take 1 tablet by mouth every 6 (six) hours as needed for severe pain. 02/24/15   Horton, Barbette Hair, MD  predniSONE (DELTASONE) 10 MG tablet Take 2 tablets (20 mg total) by mouth daily. 02/14/20   Sandrina Heaton, Darrelyn Hillock, FNP  tamsulosin (FLOMAX) 0.4 MG CAPS capsule Take 1 capsule (0.4 mg total) by mouth daily. 02/24/15   Horton, Barbette Hair, MD  terazosin (HYTRIN) 2 MG capsule Take 1 capsule by mouth 2 (two) times daily. 01/16/14   [provider]  triamterene-hydrochlorothiazide (MAXZIDE) 75-50 MG per tablet Take 1 tablet by mouth daily.    [provider]    Family History Family History  Problem Relation Age of Onset  . Healthy Mother   . Healthy Father     Social History Social History   Tobacco Use  . Smoking status: Never Smoker  . Smokeless tobacco: Never Used  Substance Use Topics  . Alcohol use: No  . Drug use: No     Allergies   Patient has no known allergies.   Review of Systems Review of Systems  Constitutional: Negative.   Respiratory: Negative.   Cardiovascular: Negative.  Skin: Positive for color change.  All other systems reviewed and are negative.    Physical Exam Triage Vital Signs ED Triage Vitals  Enc Vitals Group     BP      Pulse      Resp      Temp      Temp src      SpO2      Weight      Height      Head Circumference      Peak Flow      Pain Score      Pain Loc      Pain Edu?      Excl. in GC?    No data found.  Updated Vital Signs BP (!) 146/87   Pulse 74   Temp 97.6 F (36.4 C)   Resp 18   SpO2 94%   Visual Acuity Right Eye Distance:   Left Eye Distance:   Bilateral Distance:    Right Eye Near:   Left Eye Near:    Bilateral Near:     Physical Exam Vitals and nursing note reviewed.  Constitutional:       General: He is not in acute distress.    Appearance: Normal appearance. He is normal weight. He is not ill-appearing or toxic-appearing.  Cardiovascular:     Rate and Rhythm: Normal rate and regular rhythm.     Pulses: Normal pulses.     Heart sounds: Normal heart sounds. No murmur. No friction rub. No gallop.   Pulmonary:     Effort: Pulmonary effort is normal. No respiratory distress.     Breath sounds: Normal breath sounds. No wheezing, rhonchi or rales.  Chest:     Chest wall: No tenderness.  Skin:    General: Skin is warm.     Findings: Erythema and rash present. Rash is macular.  Neurological:     Mental Status: He is alert.      UC Treatments / Results  Labs (all labs ordered are listed, but only abnormal results are displayed) Labs Reviewed - No data to display  EKG   Radiology No results found.  Procedures Procedures (including critical care time)  Medications Ordered in UC Medications - No data to display  Initial Impression / Assessment and Plan / UC Course  I have reviewed the triage vital signs and the nursing notes.  Pertinent labs & imaging results that were available during my care of the patient were reviewed by me and considered in my medical decision making (see chart for details).     Patient is stable at discharge. Will prescribe single dose of doxycycline Advised patient to follow-up with PCP Strict ER return precaution was given   Final Clinical Impressions(s) / UC Diagnoses   Final diagnoses:  Tick bite, initial encounter  Rash in adult     Discharge Instructions     Prescribed single prophylactic dose of doxycycline 200 mg To prevent tick bites, wear long sleeves, long pants, and light colors. Use DEET insect repellent. Follow the instructions on the bottle. If the tick is biting, do not try to remove it with heat, alcohol, petroleum jelly, or fingernail polish. Use tweezers, curved forceps, or a tick-removal tool to grasp the  tick. Gently pull up until the tick lets go. Do not twist or jerk the tick. Do not squeeze or crush the tick. Return here or go to ER if you have any new or worsening symptoms (rash, nausea, vomiting, fever,  chills, headache, fatigue)     ED Prescriptions    Medication Sig Dispense Auth. Provider   doxycycline (VIBRAMYCIN) 100 MG capsule Take 2 capsules (200 mg total) by mouth once for 1 dose. 2 capsule Zakhai Meisinger, Zachery Dakins, FNP     PDMP not reviewed this encounter.       Durward Parcel, FNP 03/12/20 1927

## 2020-02-18 NOTE — ED Triage Notes (Signed)
Pt presents with tick bite on back of right thigh. Tick has been removed but states area looks irritated and abnormal

## 2021-10-11 ENCOUNTER — Other Ambulatory Visit: Payer: Self-pay

## 2021-10-11 ENCOUNTER — Ambulatory Visit
Admission: EM | Admit: 2021-10-11 | Discharge: 2021-10-11 | Disposition: A | Discharge status: Home / Self Care | Payer: Non-veteran care | Attending: Student | Admitting: Student

## 2021-10-11 DIAGNOSIS — J069 Acute upper respiratory infection, unspecified: Secondary | ICD-10-CM | POA: Diagnosis not present

## 2021-10-11 MED ORDER — PREDNISONE 20 MG PO TABS: 20.0000 mg | ORAL_TABLET | Freq: Every day | ORAL | 0 refills | Status: AC

## 2021-10-11 NOTE — ED Provider Notes (Signed)
[facility]    CSN: [id number] Arrival date & time: 10/11/21  1441      History   Chief Complaint Chief Complaint  Patient presents with  . Nasal Congestion    HPI KARTIER ATER is a 75 y.o. male presenting with nasal congestion and facial pressure for 4 days following exposure to a head cold.  Medical history noncontributory, denies allergic rhinitis component.  Denies facial pressure, purulent nasal discharge, headaches.  Occasional nonproductive cough, denies sore throat or postnasal drip.  Has not tried medications for the symptoms.  Does not monitor temperature at home, denies chills.  HPI  Past Medical History:  Diagnosis Date  . Gout   . Hypertension     There are no problems to display for this patient.   Past Surgical History:  Procedure Laterality Date  . BACK SURGERY    . CHOLECYSTECTOMY    . NECK SURGERY    . UMBILICAL HERNIA REPAIR         Home Medications    Prior to Admission medications   Medication Sig Start Date End Date Taking? Authorizing Provider  predniSONE (DELTASONE) 20 MG tablet Take 1 tablet (20 mg total) by mouth daily for 5 days. 10/11/21 10/16/21 Yes Hazel Sams, PA-C  allopurinol (ZYLOPRIM) 300 MG tablet Take 0.5 tablets by mouth daily. 12/09/13   [provider]  busPIRone (BUSPAR) 10 MG tablet Take 1 tablet by mouth 3 (three) times daily. 02/09/14   [provider]  cholecalciferol (VITAMIN D) 1000 UNITS tablet Take 1,000 Units by mouth daily.    [provider]  hydroxypropyl methylcellulose (ISOPTO TEARS) 2.5 % ophthalmic solution Place 1 drop into both eyes daily.    [provider]  ibuprofen (ADVIL,MOTRIN) 400 MG tablet Take 1 tablet by mouth 3 (three) times daily. 02/06/14   [provider]  methocarbamol (ROBAXIN) 750 MG tablet Take 750 mg by mouth 4 (four) times daily.    [provider]  nortriptyline (PAMELOR) 50 MG capsule Take 1 capsule by mouth at bedtime.  01/15/14   [provider]  Omega-3 Fatty Acids (FISH OIL) 1000 MG CAPS Take 2 capsules by mouth 3 (three) times daily before meals.    [provider]  omeprazole (PRILOSEC) 20 MG capsule Take 1 capsule by mouth daily. 01/22/14   [provider]  oxyCODONE-acetaminophen (PERCOCET/ROXICET) 5-325 MG per tablet Take 1 tablet by mouth every 6 (six) hours as needed for severe pain. 02/24/15   Horton, Barbette Hair, MD  tamsulosin (FLOMAX) 0.4 MG CAPS capsule Take 1 capsule (0.4 mg total) by mouth daily. 02/24/15   Horton, Barbette Hair, MD  terazosin (HYTRIN) 2 MG capsule Take 1 capsule by mouth 2 (two) times daily. 01/16/14   [provider]  triamterene-hydrochlorothiazide (MAXZIDE) 75-50 MG per tablet Take 1 tablet by mouth daily.    [provider]    Family History Family History  Problem Relation Age of Onset  . Healthy Mother   . Healthy Father     Social History Social History   Tobacco Use  . Smoking status: Never  . Smokeless tobacco: Never  Substance Use Topics  . Alcohol use: No  . Drug use: No     Allergies   Patient has no known allergies.   Review of Systems Review of Systems  Constitutional:  Negative for appetite change, chills and fever.  HENT:  Positive for congestion. Negative for ear pain, rhinorrhea, sinus pressure, sinus pain and  sore throat.   Eyes:  Negative for redness and visual disturbance.  Respiratory:  Positive for cough. Negative for chest tightness, shortness of breath and wheezing.   Cardiovascular:  Negative for chest pain and palpitations.  Gastrointestinal:  Negative for abdominal pain, constipation, diarrhea, nausea and vomiting.  Genitourinary:  Negative for dysuria, frequency and urgency.  Musculoskeletal:  Negative for myalgias.  Neurological:  Negative for dizziness, weakness and headaches.  Psychiatric/Behavioral:  Negative for confusion.   All other systems reviewed and are negative.   Physical  Exam Triage Vital Signs ED Triage Vitals  Enc Vitals Group     BP 10/11/21 1711 (!) 154/84     Pulse Rate 10/11/21 1711 86     Resp 10/11/21 1711 18     Temp 10/11/21 1711 98.7 F (37.1 C)     Temp Source 10/11/21 1711 Oral     SpO2 10/11/21 1711 92 %     Weight --      Height --      Head Circumference --      Peak Flow --      Pain Score 10/11/21 1710 6     Pain Loc --      Pain Edu? --      Excl. in GC? --    No data found.  Updated Vital Signs BP (!) 154/84 (BP Location: Right Arm)   Pulse 86   Temp 98.7 F (37.1 C) (Oral)   Resp 18   SpO2 92%   Visual Acuity Right Eye Distance:   Left Eye Distance:   Bilateral Distance:    Right Eye Near:   Left Eye Near:    Bilateral Near:     Physical Exam Vitals reviewed.  Constitutional:      General: He is not in acute distress.    Appearance: Normal appearance. He is not ill-appearing.  HENT:     Head: Normocephalic and atraumatic.     Right Ear: Tympanic membrane, ear canal and external ear normal. No tenderness. No middle ear effusion. There is no impacted cerumen. Tympanic membrane is not perforated, erythematous, retracted or bulging.     Left Ear: Tympanic membrane, ear canal and external ear normal. No tenderness.  No middle ear effusion. There is no impacted cerumen. Tympanic membrane is not perforated, erythematous, retracted or bulging.     Nose: Nose normal. No congestion.     Right Sinus: No maxillary sinus tenderness or frontal sinus tenderness.     Left Sinus: No maxillary sinus tenderness or frontal sinus tenderness.     Mouth/Throat:     Mouth: Mucous membranes are moist.     Pharynx: Uvula midline. No oropharyngeal exudate or posterior oropharyngeal erythema.  Eyes:     Extraocular Movements: Extraocular movements intact.     Pupils: Pupils are equal, round, and reactive to light.  Cardiovascular:     Rate and Rhythm: Normal rate and regular rhythm.     Heart sounds: Normal heart sounds.   Pulmonary:     Effort: Pulmonary effort is normal.     Breath sounds: Normal breath sounds. No decreased breath sounds, wheezing, rhonchi or rales.  Abdominal:     Palpations: Abdomen is soft.     Tenderness: There is no abdominal tenderness. There is no guarding or rebound.  Lymphadenopathy:     Cervical: No cervical adenopathy.     Right cervical: No superficial cervical adenopathy.    Left cervical: No superficial cervical adenopathy.  Neurological:  General: No focal deficit present.     Mental Status: He is alert and oriented to person, place, and time.  Psychiatric:        Mood and Affect: Mood normal.        Behavior: Behavior normal.        Thought Content: Thought content normal.        Judgment: Judgment normal.     UC Treatments / Results  Labs (all labs ordered are listed, but only abnormal results are displayed) Labs Reviewed - No data to display  EKG   Radiology No results found.  Procedures Procedures (including critical care time)  Medications Ordered in UC Medications - No data to display  Initial Impression / Assessment and Plan / UC Course  I have reviewed the triage vital signs and the nursing notes.  Pertinent labs & imaging results that were available during my care of the patient were reviewed by me and considered in my medical decision making (see chart for details).     This patient is a very pleasant 75 y.o. year old male presenting with viral URI. Today this pt is afebrile nontachycardic nontachypneic, oxygenating well on room air, no wheezes rhonchi or rales. Has not had influenza vaccine. Declines Covid, Influenza screen.  Discussed that he does not have bacterial sinusitis, but we could treat with low-dose prednisone as below.  He is in agreement.  Sent.   Final Clinical Impressions(s) / UC Diagnoses   Final diagnoses:  Viral URI with cough     Discharge Instructions      -Prednisone one pill daily x5 days. This medication  can give you energy so take earlier in the day   ED Prescriptions     Medication Sig Dispense Auth. Provider   predniSONE (DELTASONE) 20 MG tablet Take 1 tablet (20 mg total) by mouth daily for 5 days. 5 tablet Hazel Sams, PA-C      PDMP not reviewed this encounter.   Hazel Sams, PA-C 10/11/21 1807

## 2021-10-11 NOTE — ED Triage Notes (Signed)
Pt reports nasal congestion and facial pressure x 4 days.

## 2021-10-11 NOTE — Discharge Instructions (Addendum)
-  Prednisone one pill daily x5 days. This medication can give you energy so take earlier in the day

## 2022-12-04 ENCOUNTER — Ambulatory Visit
Admission: EM | Admit: 2022-12-04 | Discharge: 2022-12-04 | Disposition: A | Discharge status: Home / Self Care | Payer: No Typology Code available for payment source | Attending: Family Medicine | Admitting: Family Medicine

## 2022-12-04 ENCOUNTER — Encounter: Payer: Self-pay | Admitting: Emergency Medicine

## 2022-12-04 DIAGNOSIS — R059 Cough, unspecified: Secondary | ICD-10-CM | POA: Diagnosis present

## 2022-12-04 DIAGNOSIS — J069 Acute upper respiratory infection, unspecified: Secondary | ICD-10-CM | POA: Diagnosis not present

## 2022-12-04 DIAGNOSIS — Z1152 Encounter for screening for COVID-19: Secondary | ICD-10-CM | POA: Insufficient documentation

## 2022-12-04 MED ORDER — PROMETHAZINE-DM 6.25-15 MG/5ML PO SYRP: 5.0000 mL | ORAL_SOLUTION | Freq: Four times a day (QID) | ORAL | 0 refills | Status: AC | PRN

## 2022-12-04 NOTE — ED Triage Notes (Signed)
Pt reports a cough since Friday. Has been taking Alka Seltzer plus with no relief.

## 2022-12-04 NOTE — ED Provider Notes (Signed)
[facility]    CSN: [id number] Arrival date & time: 12/04/22  0916      History   Chief Complaint Chief Complaint  Patient presents with   Cough    HPI Lance Spencer is a 77 y.o. male.   Patient presenting today with 4-day history of hacking cough, nasal congestion.  Denies fever, chills, body aches, chest pain, shortness of breath, abdominal pain, nausea vomiting or diarrhea.  No known pertinent chronic medical problems per patient.  Taking Alka-Seltzer, Flonase, cough syrup with minimal relief.  No known sick contacts recently.    Past Medical History:  Diagnosis Date   Gout    Hypertension     There are no problems to display for this patient.   Past Surgical History:  Procedure Laterality Date   BACK SURGERY     CHOLECYSTECTOMY     NECK SURGERY     UMBILICAL HERNIA REPAIR         Home Medications    Prior to Admission medications   Medication Sig Start Date End Date Taking? Authorizing Provider  promethazine-dextromethorphan (PROMETHAZINE-DM) 6.25-15 MG/5ML syrup Take 5 mLs by mouth 4 (four) times daily as needed. 12/04/22  Yes Particia Nearing, PA-C  allopurinol (ZYLOPRIM) 300 MG tablet Take 0.5 tablets by mouth daily. 12/09/13   [provider]  busPIRone (BUSPAR) 10 MG tablet Take 1 tablet by mouth 3 (three) times daily. 02/09/14   [provider]  cholecalciferol (VITAMIN D) 1000 UNITS tablet Take 1,000 Units by mouth daily.    [provider]  hydroxypropyl methylcellulose (ISOPTO TEARS) 2.5 % ophthalmic solution Place 1 drop into both eyes daily.    [provider]  ibuprofen (ADVIL,MOTRIN) 400 MG tablet Take 1 tablet by mouth 3 (three) times daily. 02/06/14   [provider]  methocarbamol (ROBAXIN) 750 MG tablet Take 750 mg by mouth 4 (four) times daily.    [provider]  nortriptyline (PAMELOR) 50 MG capsule Take 1 capsule by mouth at bedtime. 01/15/14   [provider]  Omega-3  Fatty Acids (FISH OIL) 1000 MG CAPS Take 2 capsules by mouth 3 (three) times daily before meals.    [provider]  omeprazole (PRILOSEC) 20 MG capsule Take 1 capsule by mouth daily. 01/22/14   [provider]  oxyCODONE-acetaminophen (PERCOCET/ROXICET) 5-325 MG per tablet Take 1 tablet by mouth every 6 (six) hours as needed for severe pain. 02/24/15   Horton, Mayer Masker, MD  tamsulosin (FLOMAX) 0.4 MG CAPS capsule Take 1 capsule (0.4 mg total) by mouth daily. 02/24/15   Horton, Mayer Masker, MD  terazosin (HYTRIN) 2 MG capsule Take 1 capsule by mouth 2 (two) times daily. 01/16/14   [provider]  triamterene-hydrochlorothiazide (MAXZIDE) 75-50 MG per tablet Take 1 tablet by mouth daily.    [provider]    Family History Family History  Problem Relation Age of Onset   Healthy Mother    Healthy Father     Social History Social History   Tobacco Use   Smoking status: Never   Smokeless tobacco: Never  Substance Use Topics   Alcohol use: No   Drug use: No     Allergies   Patient has no known allergies.   Review of Systems Review of Systems PER HPI  Physical Exam Triage Vital Signs ED Triage Vitals  Enc Vitals Group     BP 12/04/22 1009 (!) 147/77     Pulse Rate 12/04/22 1009 79  Resp 12/04/22 1009 18     Temp 12/04/22 1009 98 F (36.7 C)     Temp Source 12/04/22 1009 Oral     SpO2 12/04/22 1009 93 %     Weight --      Height --      Head Circumference --      Peak Flow --      Pain Score 12/04/22 1010 0     Pain Loc --      Pain Edu? --      Excl. in [place]? --    No data found.  Updated Vital Signs BP (!) 147/77 (BP Location: Right Arm)   Pulse 79   Temp 98 F (36.7 C) (Oral)   Resp 18   SpO2 93%   Visual Acuity Right Eye Distance:   Left Eye Distance:   Bilateral Distance:    Right Eye Near:   Left Eye Near:    Bilateral Near:     Physical Exam Vitals and nursing note reviewed.  Constitutional:       Appearance: He is well-developed.  HENT:     Head: Atraumatic.     Right Ear: External ear normal.     Left Ear: External ear normal.     Nose: Rhinorrhea present.     Mouth/Throat:     Pharynx: Posterior oropharyngeal erythema present. No oropharyngeal exudate.  Eyes:     Conjunctiva/sclera: Conjunctivae normal.     Pupils: Pupils are equal, round, and reactive to light.  Cardiovascular:     Rate and Rhythm: Normal rate and regular rhythm.  Pulmonary:     Effort: Pulmonary effort is normal. No respiratory distress.     Breath sounds: No wheezing or rales.  Musculoskeletal:        General: Normal range of motion.     Cervical back: Normal range of motion and neck supple.  Lymphadenopathy:     Cervical: No cervical adenopathy.  Skin:    General: Skin is warm and dry.  Neurological:     Mental Status: He is alert and oriented to person, place, and time.  Psychiatric:        Behavior: Behavior normal.    UC Treatments / Results  Labs (all labs ordered are listed, but only abnormal results are displayed) Labs Reviewed  SARS CORONAVIRUS 2 (TAT 6-24 HRS)    EKG  Radiology No results found.  Procedures Procedures (including critical care time)  Medications Ordered in UC Medications - No data to display  Initial Impression / Assessment and Plan / UC Course  I have reviewed the triage vital signs and the nursing notes.  Pertinent labs & imaging results that were available during my care of the patient were reviewed by me and considered in my medical decision making (see chart for details).     Vital signs and exam reassuring today, suspicious for viral respiratory infection.  COVID test pending, treat with Phenergan DM, supportive over-the-counter medications and home care Final Clinical Impressions(s) / UC Diagnoses   Final diagnoses:  Viral URI with cough     Discharge Instructions      We have tested you for COVID-19 today, these results should be available  in the morning.  In the meantime, I have sent over a different cough syrup to try and you may take Mucinex, over-the-counter cold congestion medications, Flonase twice daily, drink lots of fluids and run humidifier.     ED Prescriptions     Medication Sig  Dispense Auth. Provider   promethazine-dextromethorphan (PROMETHAZINE-DM) 6.25-15 MG/5ML syrup Take 5 mLs by mouth 4 (four) times daily as needed. 100 mL Volney American, [place]      PDMP not reviewed this encounter.   Volney American, [place] 12/04/22 1047

## 2022-12-04 NOTE — Discharge Instructions (Addendum)
We have tested you for COVID-19 today, these results should be available in the morning.  In the meantime, I have sent over a different cough syrup to try and you may take Mucinex, over-the-counter cold congestion medications, Flonase twice daily, drink lots of fluids and run humidifier.

## 2022-12-05 LAB — SARS CORONAVIRUS 2 (TAT 6-24 HRS): SARS Coronavirus 2: NEGATIVE
